# Patient Record
Sex: Male | Born: 1980 | Race: Black or African American | Hispanic: No | Marital: Single | Smoking: Never smoker
Health system: Southern US, Community
[De-identification: ages and names within clinical notes are randomized; demographics above are authoritative.]

## PROBLEM LIST (undated history)

## (undated) DIAGNOSIS — R519 Headache, unspecified: Secondary | ICD-10-CM

## (undated) DIAGNOSIS — R51 Headache: Secondary | ICD-10-CM

## (undated) HISTORY — PX: NO PAST SURGERIES: SHX2092

---

## 2007-02-19 ENCOUNTER — Emergency Department (HOSPITAL_COMMUNITY): Admission: EM | Admit: 2007-02-19 | Discharge: 2007-02-19 | Payer: Self-pay | Admitting: Emergency Medicine

## 2013-06-08 ENCOUNTER — Emergency Department (INDEPENDENT_AMBULATORY_CARE_PROVIDER_SITE_OTHER)
Admission: EM | Admit: 2013-06-08 | Discharge: 2013-06-08 | Disposition: A | Discharge status: Home / Self Care | Payer: Self-pay | Source: Home / Self Care | Attending: Family Medicine | Admitting: Family Medicine

## 2013-06-08 ENCOUNTER — Encounter (HOSPITAL_COMMUNITY): Payer: Self-pay | Admitting: Emergency Medicine

## 2013-06-08 ENCOUNTER — Emergency Department (INDEPENDENT_AMBULATORY_CARE_PROVIDER_SITE_OTHER): Payer: Self-pay

## 2013-06-08 DIAGNOSIS — M25569 Pain in unspecified knee: Secondary | ICD-10-CM

## 2013-06-08 DIAGNOSIS — M25561 Pain in right knee: Secondary | ICD-10-CM

## 2013-06-08 MED ORDER — DICLOFENAC POTASSIUM 50 MG PO TABS: 50.0000 mg | ORAL_TABLET | Freq: Three times a day (TID) | ORAL | Status: DC

## 2013-06-08 NOTE — ED Notes (Signed)
Pt  Reports  r  Knee  Pain   For  About  1  Month  Or  So  denys  Recent  Injury  But  The  Pain is  Worse  On  Weight  Bearing  And  Certain  Movements

## 2013-06-08 NOTE — Discharge Instructions (Signed)
Wear knee brace and use medicine for knee and see specialist for recheck, go to mental health center to discuss issues of concern.

## 2013-06-08 NOTE — ED Provider Notes (Signed)
CSN: [id number]     Arrival date & time 06/08/13  1427 History   First MD Initiated Contact with Patient 06/08/13 1450     Chief Complaint  Patient presents with  . Knee Pain   (Consider location/radiation/quality/duration/timing/severity/associated sxs/prior Treatment) Patient is a 33 y.o. male presenting with knee pain. The history is provided by the patient.  Knee Pain Location:  Knee Time since incident:  1 month Injury: no   Knee location:  R knee Pain details:    Quality:  Sharp   Radiates to:  Does not radiate   Severity:  Mild   Onset quality:  Gradual Chronicity:  Recurrent Dislocation: no   Foreign body present:  No foreign bodies Prior injury to area:  No Relieved by:  None tried Worsened by:  Bearing weight Associated symptoms: no decreased ROM, no muscle weakness and no swelling     History reviewed. No pertinent past medical history. History reviewed. No pertinent past surgical history. No family history on file. History  Substance Use Topics  . Smoking status: Never Smoker   . Smokeless tobacco: Not on file  . Alcohol Use: No    Review of Systems  Constitutional: Negative.   Musculoskeletal: Negative for gait problem and joint swelling.  Skin: Negative.     Allergies  Review of patient's allergies indicates not on file.  Home Medications   Prior to Admission medications   Not on File   BP 132/94  Pulse 74  Temp(Src) 99 F (37.2 C) (Oral)  Resp 16  SpO2 99% Physical Exam  Nursing note and vitals reviewed. Constitutional: He is oriented to person, place, and time. He appears well-developed and well-nourished.  Musculoskeletal: He exhibits tenderness.       Right knee: He exhibits normal range of motion, no swelling, no effusion, no erythema, normal alignment, no LCL laxity, normal patellar mobility, normal meniscus and no MCL laxity. Tenderness found. Patellar tendon tenderness noted. No medial joint line and no lateral joint line tenderness  noted.  Neurological: He is alert and oriented to person, place, and time.  Skin: Skin is warm and dry.    ED Course  Procedures (including critical care time) Labs Review Labs Reviewed - No data to display  Imaging Review Dg Knee Complete 4 Views Right  06/08/2013   CLINICAL DATA:  Pain  EXAM: RIGHT KNEE - COMPLETE 4+ VIEW  COMPARISON:  None.  FINDINGS: Frontal, lateral, and bilateral oblique views were obtained. There is no fracture, dislocation, or effusion. Joint spaces appear intact. No erosive change.  IMPRESSION: No abnormality noted.   Electronically Signed   By: Bretta BangWilliam  Woodruff M.D.   On: 06/08/2013 15:27   X-rays reviewed and report per radiologist.   MDM   1. Knee pain, right anterior        Linna HoffJames D Sayf Kerner, MD 06/08/13 [id number]

## 2016-08-16 ENCOUNTER — Emergency Department (HOSPITAL_COMMUNITY)
Admission: EM | Admit: 2016-08-16 | Discharge: 2016-08-16 | Disposition: A | Discharge status: Home / Self Care | Payer: No Typology Code available for payment source | Attending: Emergency Medicine | Admitting: Emergency Medicine

## 2016-08-16 ENCOUNTER — Encounter (HOSPITAL_COMMUNITY): Payer: Self-pay | Admitting: Emergency Medicine

## 2016-08-16 ENCOUNTER — Emergency Department (HOSPITAL_COMMUNITY): Payer: No Typology Code available for payment source

## 2016-08-16 DIAGNOSIS — Y9389 Activity, other specified: Secondary | ICD-10-CM | POA: Insufficient documentation

## 2016-08-16 DIAGNOSIS — Y998 Other external cause status: Secondary | ICD-10-CM | POA: Diagnosis not present

## 2016-08-16 DIAGNOSIS — Y92410 Unspecified street and highway as the place of occurrence of the external cause: Secondary | ICD-10-CM | POA: Insufficient documentation

## 2016-08-16 DIAGNOSIS — S199XXA Unspecified injury of neck, initial encounter: Secondary | ICD-10-CM | POA: Diagnosis present

## 2016-08-16 DIAGNOSIS — S14109A Unspecified injury at unspecified level of cervical spinal cord, initial encounter: Secondary | ICD-10-CM

## 2016-08-16 DIAGNOSIS — S161XXA Strain of muscle, fascia and tendon at neck level, initial encounter: Secondary | ICD-10-CM | POA: Diagnosis not present

## 2016-08-16 DIAGNOSIS — R202 Paresthesia of skin: Secondary | ICD-10-CM

## 2016-08-16 LAB — BASIC METABOLIC PANEL
ANION GAP: 7 (ref 5–15)
BUN: 6 mg/dL (ref 6–20)
CALCIUM: 9 mg/dL (ref 8.9–10.3)
CO2: 27 mmol/L (ref 22–32)
Chloride: 105 mmol/L (ref 101–111)
Creatinine, Ser: 1.14 mg/dL (ref 0.61–1.24)
Glucose, Bld: 105 mg/dL — ABNORMAL HIGH (ref 65–99)
POTASSIUM: 3.7 mmol/L (ref 3.5–5.1)
Sodium: 139 mmol/L (ref 135–145)

## 2016-08-16 LAB — CBC
HEMATOCRIT: 38.3 % — AB (ref 39.0–52.0)
Hemoglobin: 13.1 g/dL (ref 13.0–17.0)
MCH: 32.1 pg (ref 26.0–34.0)
MCHC: 34.2 g/dL (ref 30.0–36.0)
MCV: 93.9 fL (ref 78.0–100.0)
PLATELETS: 186 10*3/uL (ref 150–400)
RBC: 4.08 MIL/uL — AB (ref 4.22–5.81)
RDW: 11.8 % (ref 11.5–15.5)
WBC: 10.2 10*3/uL (ref 4.0–10.5)

## 2016-08-16 MED ORDER — KETOROLAC TROMETHAMINE 15 MG/ML IJ SOLN
15.0000 mg | Freq: Once | INTRAMUSCULAR | Status: AC
  Administered 2016-08-16: 15 mg via INTRAVENOUS
  Filled 2016-08-16: qty 1

## 2016-08-16 MED ORDER — GABAPENTIN 100 MG PO CAPS: 200.0000 mg | ORAL_CAPSULE | Freq: Three times a day (TID) | ORAL | 0 refills | Status: AC

## 2016-08-16 MED ORDER — FENTANYL CITRATE (PF) 100 MCG/2ML IJ SOLN
50.0000 ug | INTRAMUSCULAR | Status: DC | PRN
  Administered 2016-08-16 (×2): 50 ug via INTRAVENOUS
  Filled 2016-08-16 (×2): qty 2
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
## 2016-08-16 NOTE — ED Notes (Signed)
Pt out of the dept. At this time.  Family waiting in room

## 2016-08-16 NOTE — Discharge Instructions (Signed)
Take Tylenol and ibuprofen as needed for pain. For nervelike pain you can take gabapentin. Follow-up with neurosurgery. If he develop worsening neurologic symptoms such as weakness, inability to urinate or other concerns please call your surgery office immediately or come to the emergency department.

## 2016-08-16 NOTE — ED Notes (Signed)
Pt returned from MRI °

## 2016-08-16 NOTE — ED Notes (Signed)
Pt to CT at this time.

## 2016-08-16 NOTE — ED Notes (Signed)
Pt requesting more pain meds.  Fentanyl IV given for same
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
## 2016-08-20 NOTE — ED Notes (Signed)
Pt. Called and left message on this RN's office voicemail. Pt. Requested a call back for information on his discharge. (Pt., stated , "I lost my papers.")   Called pt. No answer , unable to leave a message

## 2016-08-28 ENCOUNTER — Other Ambulatory Visit: Payer: Self-pay | Admitting: Neurological Surgery

## 2016-09-03 ENCOUNTER — Encounter (HOSPITAL_COMMUNITY): Payer: Self-pay

## 2016-09-03 ENCOUNTER — Other Ambulatory Visit (HOSPITAL_COMMUNITY): Payer: Self-pay | Admitting: *Deleted

## 2016-09-03 ENCOUNTER — Encounter (HOSPITAL_COMMUNITY)
Admission: RE | Admit: 2016-09-03 | Discharge: 2016-09-03 | Disposition: A | Discharge status: Home / Self Care | Payer: Commercial Managed Care - PPO | Source: Ambulatory Visit | Attending: Neurological Surgery | Admitting: Neurological Surgery

## 2016-09-03 DIAGNOSIS — M4802 Spinal stenosis, cervical region: Secondary | ICD-10-CM

## 2016-09-03 DIAGNOSIS — Z01812 Encounter for preprocedural laboratory examination: Secondary | ICD-10-CM

## 2016-09-03 HISTORY — DX: Headache: R51

## 2016-09-03 HISTORY — DX: Headache, unspecified: R51.9

## 2016-09-03 LAB — ABO/RH: ABO/RH(D): O POS

## 2016-09-03 LAB — CBC
HEMATOCRIT: 41.3 % (ref 39.0–52.0)
Hemoglobin: 14.1 g/dL (ref 13.0–17.0)
MCH: 31.7 pg (ref 26.0–34.0)
MCHC: 34.1 g/dL (ref 30.0–36.0)
MCV: 92.8 fL (ref 78.0–100.0)
Platelets: 207 10*3/uL (ref 150–400)
RBC: 4.45 MIL/uL (ref 4.22–5.81)
RDW: 11.6 % (ref 11.5–15.5)
WBC: 6.1 10*3/uL (ref 4.0–10.5)

## 2016-09-03 LAB — SURGICAL PCR SCREEN
MRSA, PCR: NEGATIVE
STAPHYLOCOCCUS AUREUS: NEGATIVE

## 2016-09-03 LAB — TYPE AND SCREEN
ABO/RH(D): O POS
Antibody Screen: NEGATIVE

## 2016-09-03 NOTE — Pre-Procedure Instructions (Addendum)
Estanislado PandyJames Lewman  09/03/2016    Your procedure is scheduled on Friday, September 06, 2016 at 7:30 AM.   [facility] En[facility] "A" Admitting Office at 5:30 AM.   Call this number if you have problems the morning of surgery: [phone]   Questions prior to day of surgery, please call [phone] between 8 & 4 PM.   Remember:  Do not eat food or drink liquids after midnight Thursday, 09/05/16.  Take these medicines the morning of surgery with A SIP OF WATER: Gabapentin (Neurontin)  STOP all herbel meds, nsaids (aleve,naproxen,advil,ibuprofen) prior to surgery starting now 09/03/16  including all supplements//vitamins, aspirin, goodys,fish oil, bc powder   Do not wear jewelry.  Do not wear lotions, powders, cologen or deodorant.  Do not shave 48 hours prior to surgery.  Men may shave face and neck.  Do not bring valuables to the hospital.  [facility] any belongings or valuables.  Contacts, dentures or bridgework may not be worn into surgery.  Leave your suitcase in the car.  After surgery it may be brought to your room.  For patients admitted to the hospital, discharge time will be determined by your treatment team.  [facility] - Preparing for [facility]  Before surgery, you can play an important role.  Because skin is not sterile, your skin needs to be as free of germs as possible.  You can reduce the number of germs on you skin by washing with CHG (chlorahexidine gluconate) soap before surgery.  CHG is an antiseptic cleaner which kills germs and bonds with the skin to continue killing germs even after washing.  Please DO NOT use if you have an allergy to CHG or antibacterial soaps.  If your skin becomes reddened/irritated stop using the CHG and inform your nurse when you arrive at Short Stay.  Do not shave (including legs and underarms) for at least 48 hours prior to the first CHG shower.  You may shave your face.  Please follow these instructions  carefully:   1.  Shower with CHG Soap the night before surgery and the                    morning of Surgery.  2.  If you choose to wash your hair, wash your hair first as usual with your       normal shampoo.  3.  After you shampoo, rinse your hair and body thoroughly to remove the shampoo.  4.  Use CHG as you would any other liquid soap.  You can apply chg directly       to the skin and wash gently with scrungie or a clean washcloth.  5.  Apply the CHG Soap to your body ONLY FROM THE NECK DOWN.        Do not use on open wounds or open sores.  Avoid contact with your eyes, ears, mouth and genitals (private parts).  Wash genitals (private parts) with your normal soap.  6.  Wash thoroughly, paying special attention to the area where your surgery        will be performed.  7.  Thoroughly rinse your body with warm water from the neck down.  8.  DO NOT shower/wash with your normal soap after using and rinsing off       the CHG Soap.  9.  Pat yourself dry with a clean towel.  10.  Wear clean pajamas.            11.  Place clean sheets on your bed the night of your first shower and do not        sleep with pets.  Day of Surgery  Do not apply any lotions/deodorants the morning of surgery.  Please wear clean clothes to the hospital.   Please read over the fact sheets that you were given.

## 2016-09-03 NOTE — Pre-Procedure Instructions (Signed)
Andre Salinas  09/03/2016    Your procedure is scheduled on Friday, September 06, 2016 at 7:30 AM.   Report to [facility] Entrance "A" Admitting Office at 5:30 AM.   Call this number if you have problems the morning of surgery: [phone]   Questions prior to day of surgery, please call [phone] between 8 & 4 PM.   Remember:  Do not eat food or drink liquids after midnight Thursday, 09/05/16.  Take these medicines the morning of surgery with A SIP OF WATER: Gabapentin (Neurontin)   Do not wear jewelry.  Do not wear lotions, powders, cologen or deodorant.  Do not shave 48 hours prior to surgery.  Men may shave face and neck.  Do not bring valuables to the hospital.  [facility] is not responsible for any belongings or valuables.  Contacts, dentures or bridgework may not be worn into surgery.  Leave your suitcase in the car.  After surgery it may be brought to your room.  For patients admitted to the hospital, discharge time will be determined by your treatment team.  [facility] - Preparing for Surgery  Before surgery, you can play an important role.  Because skin is not sterile, your skin needs to be as free of germs as possible.  You can reduce the number of germs on you skin by washing with CHG (chlorahexidine gluconate) soap before surgery.  CHG is an antiseptic cleaner which kills germs and bonds with the skin to continue killing germs even after washing.  Please DO NOT use if you have an allergy to CHG or antibacterial soaps.  If your skin becomes reddened/irritated stop using the CHG and inform your nurse when you arrive at Short Stay.  Do not shave (including legs and underarms) for at least 48 hours prior to the first CHG shower.  You may shave your face.  Please follow these instructions carefully:   1.  Shower with CHG Soap the night before surgery and the                    morning of Surgery.  2.  If you choose to wash your hair, wash your hair first as  usual with your       normal shampoo.  3.  After you shampoo, rinse your hair and body thoroughly to remove the shampoo.  4.  Use CHG as you would any other liquid soap.  You can apply chg directly       to the skin and wash gently with scrungie or a clean washcloth.  5.  Apply the CHG Soap to your body ONLY FROM THE NECK DOWN.        Do not use on open wounds or open sores.  Avoid contact with your eyes, ears, mouth and genitals (private parts).  Wash genitals (private parts) with your normal soap.  6.  Wash thoroughly, paying special attention to the area where your surgery        will be performed.  7.  Thoroughly rinse your body with warm water from the neck down.  8.  DO NOT shower/wash with your normal soap after using and rinsing off       the CHG Soap.  9.  Pat yourself dry with a clean towel.            10.  Wear clean pajamas.            11.  Place clean sheets  on your bed the night of your first shower and do not        sleep with pets.  Day of Surgery  Do not apply any lotions/deodorants the morning of surgery.  Please wear clean clothes to the hospital.   Please read over the fact sheets that you were given.

## 2016-09-05 NOTE — Anesthesia Preprocedure Evaluation (Addendum)
Anesthesia Evaluation  Patient identified by MRN, date of birth, ID band Patient awake    Reviewed: Allergy & Precautions, NPO status , Patient's Chart, lab work & pertinent test results  History of Anesthesia Complications Negative for: history of anesthetic complications  Airway Mallampati: I  TM Distance: >3 FB Neck ROM: Limited    Dental  (+) Teeth Intact, Dental Advisory Given   Pulmonary neg pulmonary ROS,  Asthma as a child   breath sounds clear to auscultation       Cardiovascular Exercise Tolerance: Good negative cardio ROS   Rhythm:Regular Rate:Normal     Neuro/Psych  Headaches, tingling and numbness in bilateral arms (worse in left) negative psych ROS   GI/Hepatic negative GI ROS, Neg liver ROS,   Endo/Other  negative endocrine ROS  Renal/GU negative Renal ROS  negative genitourinary   Musculoskeletal negative musculoskeletal ROS (+)   Abdominal   Peds negative pediatric ROS (+)  Hematology negative hematology ROS (+)   Anesthesia Other Findings   Reproductive/Obstetrics                            BP Readings from Last 3 Encounters:  09/06/16 129/77  09/03/16 119/71  08/16/16 121/88   Lab Results  Component Value Date   WBC 6.1 09/03/2016   HGB 14.1 09/03/2016   HCT 41.3 09/03/2016   MCV 92.8 09/03/2016   PLT 207 09/03/2016    Anesthesia Physical Anesthesia Plan  ASA: I  Anesthesia Plan: General   Post-op Pain Management:    Induction: Intravenous  PONV Risk Score and Plan: 3 and Ondansetron, Dexamethasone and Midazolam  Airway Management Planned: Video Laryngoscope Planned and Oral ETT  Additional Equipment:   Intra-op Plan:   Post-operative Plan: Extubation in OR  Informed Consent: I have reviewed the patients History and Physical, chart, labs and discussed the procedure including the risks, benefits and alternatives for the proposed anesthesia  with the patient or authorized representative who has indicated his/her understanding and acceptance.   Dental advisory given  Plan Discussed with: CRNA and Surgeon  Anesthesia Plan Comments: (Plan routine monitors, GETA with VideoGlide intubation)        Anesthesia Quick Evaluation

## 2016-09-06 ENCOUNTER — Inpatient Hospital Stay (HOSPITAL_COMMUNITY): Payer: Commercial Managed Care - PPO | Admitting: Certified Registered Nurse Anesthetist

## 2016-09-06 ENCOUNTER — Inpatient Hospital Stay (HOSPITAL_COMMUNITY)
Admission: RE | Admit: 2016-09-06 | Discharge: 2016-09-07 | DRG: 030 | Disposition: A | Discharge status: Home / Self Care | Payer: Commercial Managed Care - PPO | Source: Ambulatory Visit | Attending: Neurological Surgery | Admitting: Neurological Surgery

## 2016-09-06 ENCOUNTER — Encounter (HOSPITAL_COMMUNITY)
Admission: RE | Disposition: A | Discharge status: Home / Self Care | Payer: Self-pay | Source: Ambulatory Visit | Attending: Neurological Surgery

## 2016-09-06 ENCOUNTER — Inpatient Hospital Stay (HOSPITAL_COMMUNITY): Payer: Commercial Managed Care - PPO

## 2016-09-06 ENCOUNTER — Encounter (HOSPITAL_COMMUNITY): Payer: Self-pay | Admitting: *Deleted

## 2016-09-06 DIAGNOSIS — Z91018 Allergy to other foods: Secondary | ICD-10-CM

## 2016-09-06 DIAGNOSIS — M4802 Spinal stenosis, cervical region: Secondary | ICD-10-CM | POA: Diagnosis present

## 2016-09-06 DIAGNOSIS — S14124S Central cord syndrome at C4 level of cervical spinal cord, sequela: Secondary | ICD-10-CM | POA: Diagnosis not present

## 2016-09-06 HISTORY — PX: POSTERIOR CERVICAL FUSION/FORAMINOTOMY: SHX5038

## 2016-09-06 SURGERY — POSTERIOR CERVICAL FUSION/FORAMINOTOMY LEVEL 3
Anesthesia: General

## 2016-09-06 MED ORDER — HEMOSTATIC AGENTS (NO CHARGE) OPTIME
TOPICAL | Status: DC | PRN
  Administered 2016-09-06: 1 via TOPICAL

## 2016-09-06 MED ORDER — THROMBIN 5000 UNITS EX SOLR
CUTANEOUS | Status: AC
  Filled 2016-09-06: qty 5000

## 2016-09-06 MED ORDER — OXYCODONE HCL ER 15 MG PO T12A
15.0000 mg | EXTENDED_RELEASE_TABLET | Freq: Two times a day (BID) | ORAL | Status: DC
  Administered 2016-09-06 – 2016-09-07 (×3): 15 mg via ORAL
  Filled 2016-09-06 (×3): qty 1

## 2016-09-06 MED ORDER — MENTHOL 3 MG MT LOZG: 1.0000 | LOZENGE | OROMUCOSAL | Status: DC | PRN

## 2016-09-06 MED ORDER — BUPIVACAINE LIPOSOME 1.3 % IJ SUSP
20.0000 mL | INTRAMUSCULAR | Status: DC
  Filled 2016-09-06: qty 20

## 2016-09-06 MED ORDER — PANTOPRAZOLE SODIUM 40 MG IV SOLR: 40.0000 mg | Freq: Every day | INTRAVENOUS | Status: DC

## 2016-09-06 MED ORDER — SODIUM CHLORIDE 0.9 % IJ SOLN
INTRAMUSCULAR | Status: AC
  Filled 2016-09-06: qty 10

## 2016-09-06 MED ORDER — ONDANSETRON HCL 4 MG/2ML IJ SOLN
INTRAMUSCULAR | Status: DC | PRN
  Administered 2016-09-06 (×2): 4 mg via INTRAVENOUS

## 2016-09-06 MED ORDER — SUGAMMADEX SODIUM 500 MG/5ML IV SOLN
INTRAVENOUS | Status: DC | PRN
  Administered 2016-09-06: 400 mg via INTRAVENOUS

## 2016-09-06 MED ORDER — PANTOPRAZOLE SODIUM 40 MG PO TBEC
40.0000 mg | DELAYED_RELEASE_TABLET | Freq: Every day | ORAL | Status: DC
  Administered 2016-09-06: 40 mg via ORAL
  Filled 2016-09-06: qty 1

## 2016-09-06 MED ORDER — HYDROMORPHONE HCL 1 MG/ML IJ SOLN: 0.2500 mg | INTRAMUSCULAR | Status: DC | PRN

## 2016-09-06 MED ORDER — BUPIVACAINE-EPINEPHRINE 0.5% -1:200000 IJ SOLN
INTRAMUSCULAR | Status: DC | PRN
  Administered 2016-09-06: 10 mL

## 2016-09-06 MED ORDER — ONDANSETRON HCL 4 MG PO TABS: 4.0000 mg | ORAL_TABLET | Freq: Four times a day (QID) | ORAL | Status: DC | PRN

## 2016-09-06 MED ORDER — SENNA 8.6 MG PO TABS
1.0000 | ORAL_TABLET | Freq: Two times a day (BID) | ORAL | Status: DC
  Administered 2016-09-06 – 2016-09-07 (×3): 8.6 mg via ORAL
  Filled 2016-09-06 (×3): qty 1

## 2016-09-06 MED ORDER — PROMETHAZINE HCL 25 MG/ML IJ SOLN: 6.2500 mg | INTRAMUSCULAR | Status: DC | PRN

## 2016-09-06 MED ORDER — BUPIVACAINE-EPINEPHRINE (PF) 0.5% -1:200000 IJ SOLN
INTRAMUSCULAR | Status: AC
  Filled 2016-09-06: qty 30

## 2016-09-06 MED ORDER — DIAZEPAM 5 MG PO TABS
5.0000 mg | ORAL_TABLET | Freq: Four times a day (QID) | ORAL | Status: DC | PRN
  Administered 2016-09-06: 5 mg via ORAL
  Filled 2016-09-06: qty 1

## 2016-09-06 MED ORDER — LIDOCAINE-EPINEPHRINE 2 %-1:100000 IJ SOLN
INTRAMUSCULAR | Status: AC
  Filled 2016-09-06: qty 1

## 2016-09-06 MED ORDER — ROCURONIUM BROMIDE 10 MG/ML (PF) SYRINGE
PREFILLED_SYRINGE | INTRAVENOUS | Status: AC
  Filled 2016-09-06: qty 10

## 2016-09-06 MED ORDER — OXYCODONE HCL 5 MG PO TABS
5.0000 mg | ORAL_TABLET | ORAL | Status: DC | PRN
  Administered 2016-09-06 – 2016-09-07 (×5): 10 mg via ORAL
  Filled 2016-09-06 (×5): qty 2

## 2016-09-06 MED ORDER — FENTANYL CITRATE (PF) 250 MCG/5ML IJ SOLN
INTRAMUSCULAR | Status: AC
Start: 2016-09-06 — End: ?
  Filled 2016-09-06: qty 5

## 2016-09-06 MED ORDER — LIDOCAINE 2% (20 MG/ML) 5 ML SYRINGE
INTRAMUSCULAR | Status: DC | PRN
  Administered 2016-09-06: 20 mg via INTRAVENOUS

## 2016-09-06 MED ORDER — SUGAMMADEX SODIUM 500 MG/5ML IV SOLN
INTRAVENOUS | Status: AC
  Filled 2016-09-06: qty 5

## 2016-09-06 MED ORDER — PHENYLEPHRINE HCL 10 MG/ML IJ SOLN
INTRAVENOUS | Status: DC | PRN
  Administered 2016-09-06: 25 ug/min via INTRAVENOUS

## 2016-09-06 MED ORDER — MIDAZOLAM HCL 2 MG/2ML IJ SOLN
INTRAMUSCULAR | Status: AC
  Filled 2016-09-06: qty 2

## 2016-09-06 MED ORDER — DEXAMETHASONE SODIUM PHOSPHATE 10 MG/ML IJ SOLN
INTRAMUSCULAR | Status: DC | PRN
Start: 2016-09-06 — End: 2016-09-06
  Administered 2016-09-06: 10 mg via INTRAVENOUS

## 2016-09-06 MED ORDER — LIDOCAINE-EPINEPHRINE 2 %-1:100000 IJ SOLN
INTRAMUSCULAR | Status: DC | PRN
  Administered 2016-09-06: 10 mL

## 2016-09-06 MED ORDER — BISACODYL 10 MG RE SUPP: 10.0000 mg | Freq: Every day | RECTAL | Status: DC | PRN

## 2016-09-06 MED ORDER — LACTATED RINGERS IV SOLN
INTRAVENOUS | Status: DC | PRN
  Administered 2016-09-06 (×2): via INTRAVENOUS

## 2016-09-06 MED ORDER — SODIUM CHLORIDE 0.9 % IR SOLN
Status: DC | PRN
  Administered 2016-09-06: 09:00:00

## 2016-09-06 MED ORDER — FENTANYL CITRATE (PF) 250 MCG/5ML IJ SOLN
INTRAMUSCULAR | Status: AC
  Filled 2016-09-06: qty 5

## 2016-09-06 MED ORDER — VANCOMYCIN HCL 1000 MG IV SOLR
INTRAVENOUS | Status: AC
  Filled 2016-09-06: qty 1000

## 2016-09-06 MED ORDER — PROPOFOL 10 MG/ML IV BOLUS
INTRAVENOUS | Status: DC | PRN
  Administered 2016-09-06: 150 mg via INTRAVENOUS
  Administered 2016-09-06: 50 mg via INTRAVENOUS

## 2016-09-06 MED ORDER — ONDANSETRON HCL 4 MG/2ML IJ SOLN: 4.0000 mg | Freq: Four times a day (QID) | INTRAMUSCULAR | Status: DC | PRN

## 2016-09-06 MED ORDER — CEFAZOLIN SODIUM-DEXTROSE 1-4 GM/50ML-% IV SOLN
1.0000 g | Freq: Three times a day (TID) | INTRAVENOUS | Status: AC
  Administered 2016-09-06 (×2): 1 g via INTRAVENOUS
  Filled 2016-09-06 (×2): qty 50

## 2016-09-06 MED ORDER — BACITRACIN ZINC 500 UNIT/GM EX OINT
TOPICAL_OINTMENT | CUTANEOUS | Status: DC | PRN
  Administered 2016-09-06: 1 via TOPICAL

## 2016-09-06 MED ORDER — 0.9 % SODIUM CHLORIDE (POUR BTL) OPTIME
TOPICAL | Status: DC | PRN
  Administered 2016-09-06: 1000 mL

## 2016-09-06 MED ORDER — SODIUM CHLORIDE 0.9 % IV SOLN
INTRAVENOUS | Status: DC | PRN
  Administered 2016-09-06: 09:00:00

## 2016-09-06 MED ORDER — BACITRACIN ZINC 500 UNIT/GM EX OINT
TOPICAL_OINTMENT | CUTANEOUS | Status: AC
  Filled 2016-09-06: qty 28.35

## 2016-09-06 MED ORDER — MEPERIDINE HCL 25 MG/ML IJ SOLN: 6.2500 mg | INTRAMUSCULAR | Status: DC | PRN

## 2016-09-06 MED ORDER — SODIUM CHLORIDE 0.9% FLUSH: 3.0000 mL | INTRAVENOUS | Status: DC | PRN

## 2016-09-06 MED ORDER — METHYLPREDNISOLONE ACETATE 80 MG/ML IJ SUSP
INTRAMUSCULAR | Status: AC
  Filled 2016-09-06: qty 1

## 2016-09-06 MED ORDER — ACETAMINOPHEN 500 MG PO TABS
1000.0000 mg | ORAL_TABLET | Freq: Four times a day (QID) | ORAL | Status: DC
  Administered 2016-09-06 – 2016-09-07 (×4): 1000 mg via ORAL
  Filled 2016-09-06 (×4): qty 2

## 2016-09-06 MED ORDER — THROMBIN 5000 UNITS EX SOLR
OROMUCOSAL | Status: DC | PRN
  Administered 2016-09-06 (×2): via TOPICAL

## 2016-09-06 MED ORDER — ZOLPIDEM TARTRATE 5 MG PO TABS: 5.0000 mg | ORAL_TABLET | Freq: Every evening | ORAL | Status: DC | PRN

## 2016-09-06 MED ORDER — GABAPENTIN 300 MG PO CAPS
300.0000 mg | ORAL_CAPSULE | Freq: Three times a day (TID) | ORAL | Status: DC
  Administered 2016-09-06 – 2016-09-07 (×3): 300 mg via ORAL
  Filled 2016-09-06 (×3): qty 1

## 2016-09-06 MED ORDER — THROMBIN 5000 UNITS EX SOLR
CUTANEOUS | Status: AC
  Filled 2016-09-06: qty 15000

## 2016-09-06 MED ORDER — PHENOL 1.4 % MT LIQD: 1.0000 | OROMUCOSAL | Status: DC | PRN

## 2016-09-06 MED ORDER — LIDOCAINE 2% (20 MG/ML) 5 ML SYRINGE
INTRAMUSCULAR | Status: AC
  Filled 2016-09-06: qty 5

## 2016-09-06 MED ORDER — CEFAZOLIN SODIUM-DEXTROSE 2-4 GM/100ML-% IV SOLN
INTRAVENOUS | Status: AC
  Filled 2016-09-06: qty 100

## 2016-09-06 MED ORDER — SODIUM CHLORIDE 0.9% FLUSH
3.0000 mL | Freq: Two times a day (BID) | INTRAVENOUS | Status: DC
  Administered 2016-09-06: 3 mL via INTRAVENOUS

## 2016-09-06 MED ORDER — DOCUSATE SODIUM 100 MG PO CAPS
100.0000 mg | ORAL_CAPSULE | Freq: Two times a day (BID) | ORAL | Status: DC
  Administered 2016-09-06 – 2016-09-07 (×3): 100 mg via ORAL
  Filled 2016-09-06 (×3): qty 1

## 2016-09-06 MED ORDER — ROCURONIUM BROMIDE 10 MG/ML (PF) SYRINGE
PREFILLED_SYRINGE | INTRAVENOUS | Status: DC | PRN
  Administered 2016-09-06: 50 mg via INTRAVENOUS
  Administered 2016-09-06: 40 mg via INTRAVENOUS
  Administered 2016-09-06: 10 mg via INTRAVENOUS
  Administered 2016-09-06: 20 mg via INTRAVENOUS

## 2016-09-06 MED ORDER — VANCOMYCIN HCL POWD
Status: DC | PRN
  Administered 2016-09-06: 1000 mg via TOPICAL

## 2016-09-06 MED ORDER — MIDAZOLAM HCL 2 MG/2ML IJ SOLN: 0.5000 mg | Freq: Once | INTRAMUSCULAR | Status: DC | PRN

## 2016-09-06 MED ORDER — PROPOFOL 10 MG/ML IV BOLUS
INTRAVENOUS | Status: AC
  Filled 2016-09-06: qty 40

## 2016-09-06 MED ORDER — THROMBIN 5000 UNITS EX SOLR
CUTANEOUS | Status: DC | PRN
  Administered 2016-09-06 (×2): 5000 [IU] via TOPICAL

## 2016-09-06 MED ORDER — ONDANSETRON HCL 4 MG/2ML IJ SOLN
INTRAMUSCULAR | Status: AC
  Filled 2016-09-06: qty 2

## 2016-09-06 MED ORDER — CHLORHEXIDINE GLUCONATE CLOTH 2 % EX PADS: 6.0000 | MEDICATED_PAD | Freq: Once | CUTANEOUS | Status: DC

## 2016-09-06 MED ORDER — FENTANYL CITRATE (PF) 250 MCG/5ML IJ SOLN
INTRAMUSCULAR | Status: DC | PRN
  Administered 2016-09-06: 200 ug via INTRAVENOUS
  Administered 2016-09-06 (×4): 50 ug via INTRAVENOUS

## 2016-09-06 MED ORDER — METHOCARBAMOL 750 MG PO TABS
750.0000 mg | ORAL_TABLET | Freq: Four times a day (QID) | ORAL | Status: DC
  Administered 2016-09-06 – 2016-09-07 (×4): 750 mg via ORAL
  Filled 2016-09-06 (×4): qty 1

## 2016-09-06 MED ORDER — FLEET ENEMA 7-19 GM/118ML RE ENEM: 1.0000 | ENEMA | Freq: Once | RECTAL | Status: DC | PRN

## 2016-09-06 MED ORDER — MIDAZOLAM HCL 2 MG/2ML IJ SOLN
INTRAMUSCULAR | Status: DC | PRN
  Administered 2016-09-06: 2 mg via INTRAVENOUS

## 2016-09-06 MED ORDER — CEFAZOLIN SODIUM-DEXTROSE 2-4 GM/100ML-% IV SOLN
2.0000 g | INTRAVENOUS | Status: AC
  Administered 2016-09-06: 2 g via INTRAVENOUS

## 2016-09-06 MED ORDER — SODIUM CHLORIDE 0.9 % IV SOLN
INTRAVENOUS | Status: DC
  Administered 2016-09-06: 14:00:00 via INTRAVENOUS

## 2016-09-06 MED ORDER — CELECOXIB 200 MG PO CAPS
200.0000 mg | ORAL_CAPSULE | Freq: Two times a day (BID) | ORAL | Status: DC
  Administered 2016-09-06 – 2016-09-07 (×2): 200 mg via ORAL
  Filled 2016-09-06 (×2): qty 1

## 2016-09-06 MED ORDER — DEXAMETHASONE SODIUM PHOSPHATE 10 MG/ML IJ SOLN
INTRAMUSCULAR | Status: AC
  Filled 2016-09-06: qty 1

## 2016-09-06 MED ORDER — SODIUM CHLORIDE 0.9 % IV SOLN: 250.0000 mL | INTRAVENOUS | Status: DC

## 2016-09-06 SURGICAL SUPPLY — 85 items
BENZOIN TINCTURE PRP APPL 2/3 (GAUZE/BANDAGES/DRESSINGS) ×3 IMPLANT
BLADE CLIPPER SURG (BLADE) ×3 IMPLANT
BLADE ULTRA TIP 2M (BLADE) IMPLANT
BONE EQUIVA 5CC (Bone Implant) ×3 IMPLANT
BUR MATCHSTICK NEURO 3.0 LAGG (BURR) IMPLANT
BUR PRECISION FLUTE 5.0 (BURR) IMPLANT
CANISTER SUCT 3000ML PPV (MISCELLANEOUS) ×3 IMPLANT
CAP CLSR POST CERV (Cap) ×24 IMPLANT
CARTRIDGE OIL MAESTRO DRILL (MISCELLANEOUS) ×1 IMPLANT
CHLORAPREP W/TINT 26ML (MISCELLANEOUS) ×3 IMPLANT
CLOSURE WOUND 1/2 X4 (GAUZE/BANDAGES/DRESSINGS)
COVER BACK TABLE 60X90IN (DRAPES) ×3 IMPLANT
DECANTER SPIKE VIAL GLASS SM (MISCELLANEOUS) ×3 IMPLANT
DERMABOND ADVANCED (GAUZE/BANDAGES/DRESSINGS) ×2
DERMABOND ADVANCED .7 DNX12 (GAUZE/BANDAGES/DRESSINGS) ×1 IMPLANT
DIFFUSER DRILL AIR PNEUMATIC (MISCELLANEOUS) ×3 IMPLANT
DRAPE C-ARM 42X72 X-RAY (DRAPES) ×6 IMPLANT
DRAPE C-ARMOR (DRAPES) ×3 IMPLANT
DRAPE MICROSCOPE LEICA (MISCELLANEOUS) IMPLANT
DRAPE POUCH INSTRU U-SHP 10X18 (DRAPES) ×3 IMPLANT
DRSG OPSITE POSTOP 4X6 (GAUZE/BANDAGES/DRESSINGS) ×3 IMPLANT
DRSG PAD ABDOMINAL 8X10 ST (GAUZE/BANDAGES/DRESSINGS) IMPLANT
ELECT REM PT RETURN 9FT ADLT (ELECTROSURGICAL) ×3
ELECTRODE REM PT RTRN 9FT ADLT (ELECTROSURGICAL) ×1 IMPLANT
EVACUATOR 1/8 PVC DRAIN (DRAIN) ×3 IMPLANT
GAUZE SPONGE 4X4 12PLY STRL (GAUZE/BANDAGES/DRESSINGS) ×3 IMPLANT
GAUZE SPONGE 4X4 16PLY XRAY LF (GAUZE/BANDAGES/DRESSINGS) IMPLANT
GLOVE BIO SURGEON STRL SZ7 (GLOVE) IMPLANT
GLOVE BIOGEL PI IND STRL 7.0 (GLOVE) IMPLANT
GLOVE BIOGEL PI IND STRL 7.5 (GLOVE) ×2 IMPLANT
GLOVE BIOGEL PI INDICATOR 7.0 (GLOVE)
GLOVE BIOGEL PI INDICATOR 7.5 (GLOVE) ×4
GLOVE SS BIOGEL STRL SZ 7.5 (GLOVE) ×1 IMPLANT
GLOVE SUPERSENSE BIOGEL SZ 7.5 (GLOVE) ×2
GOWN STRL REUS W/ TWL LRG LVL3 (GOWN DISPOSABLE) IMPLANT
GOWN STRL REUS W/ TWL XL LVL3 (GOWN DISPOSABLE) IMPLANT
GOWN STRL REUS W/TWL 2XL LVL3 (GOWN DISPOSABLE) IMPLANT
GOWN STRL REUS W/TWL LRG LVL3 (GOWN DISPOSABLE)
GOWN STRL REUS W/TWL XL LVL3 (GOWN DISPOSABLE)
HEMOSTAT POWDER SURGIFOAM 1G (HEMOSTASIS) ×6 IMPLANT
HEMOSTAT SURGICEL 2X14 (HEMOSTASIS) IMPLANT
KIT BASIN OR (CUSTOM PROCEDURE TRAY) ×3 IMPLANT
KIT INFUSE SMALL (Orthopedic Implant) ×3 IMPLANT
KIT ROOM TURNOVER OR (KITS) ×3 IMPLANT
MARKER SKIN DUAL TIP RULER LAB (MISCELLANEOUS) ×3 IMPLANT
NEEDLE HYPO 18GX1.5 BLUNT FILL (NEEDLE) IMPLANT
NEEDLE HYPO 21X1.5 SAFETY (NEEDLE) ×6 IMPLANT
NEEDLE SPNL 22GX3.5 QUINCKE BK (NEEDLE) ×3 IMPLANT
NS IRRIG 1000ML POUR BTL (IV SOLUTION) ×3 IMPLANT
OIL CARTRIDGE MAESTRO DRILL (MISCELLANEOUS) ×3
PACK LAMINECTOMY NEURO (CUSTOM PROCEDURE TRAY) ×3 IMPLANT
PACK UNIVERSAL I (CUSTOM PROCEDURE TRAY) ×3 IMPLANT
PATTIES SURGICAL .5X1.5 (GAUZE/BANDAGES/DRESSINGS) IMPLANT
PIN MAYFIELD SKULL DISP (PIN) ×3 IMPLANT
ROD VIRAGE 3.5X60 (Rod) ×6 IMPLANT
RUBBERBAND STERILE (MISCELLANEOUS) IMPLANT
SCREW VIRAGE 3.5X26MM POLY (Cage) ×6 IMPLANT
SCREW VIRAGE POST CERV 3.5X16 (Screw) ×18 IMPLANT
SEALER BIPOLAR AQUA 2.3 (INSTRUMENTS) ×3 IMPLANT
SPONGE INTESTINAL PEANUT (DISPOSABLE) IMPLANT
SPONGE NEURO XRAY DETECT 1X3 (DISPOSABLE) ×3 IMPLANT
SPONGE SURGIFOAM ABS GEL SZ50 (HEMOSTASIS) ×3 IMPLANT
STAPLER VISISTAT 35W (STAPLE) ×3 IMPLANT
STRIP CLOSURE SKIN 1/2X4 (GAUZE/BANDAGES/DRESSINGS) IMPLANT
STRIP SURGICAL 1 X 6 IN (GAUZE/BANDAGES/DRESSINGS) IMPLANT
STRIP SURGICAL 1/2 X 6 IN (GAUZE/BANDAGES/DRESSINGS) IMPLANT
STRIP SURGICAL 1/4 X 6 IN (GAUZE/BANDAGES/DRESSINGS) IMPLANT
STRIP SURGICAL 3/4 X 6 IN (GAUZE/BANDAGES/DRESSINGS) IMPLANT
SUT STRATAFIX 1PDS 45CM VIOLET (SUTURE) ×3 IMPLANT
SUT STRATAFIX MNCRL+ 3-0 PS-2 (SUTURE) ×1
SUT STRATAFIX MONOCRYL 3-0 (SUTURE) ×2
SUT STRATAFIX SPIRAL + 2-0 (SUTURE) ×3 IMPLANT
SUT VIC AB 0 CT1 18XCR BRD8 (SUTURE) ×2 IMPLANT
SUT VIC AB 0 CT1 8-18 (SUTURE) ×4
SUT VIC AB 2-0 CT1 18 (SUTURE) IMPLANT
SUT VIC AB 3-0 SH 8-18 (SUTURE) ×3 IMPLANT
SUTURE STRATFX MNCRL+ 3-0 PS-2 (SUTURE) ×1 IMPLANT
SYR 30ML LL (SYRINGE) ×3 IMPLANT
SYR 3ML LL SCALE MARK (SYRINGE) IMPLANT
SYRINGE 20CC LL (MISCELLANEOUS) ×3 IMPLANT
TOWEL GREEN STERILE (TOWEL DISPOSABLE) ×3 IMPLANT
TOWEL GREEN STERILE FF (TOWEL DISPOSABLE) ×3 IMPLANT
TRAY FOLEY W/METER SILVER 16FR (SET/KITS/TRAYS/PACK) IMPLANT
UNDERPAD 30X30 (UNDERPADS AND DIAPERS) ×3 IMPLANT
WATER STERILE IRR 1000ML POUR (IV SOLUTION) ×3 IMPLANT
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
## 2016-09-06 NOTE — Progress Notes (Signed)
Report given to maria rn as caregiver 

## 2016-09-06 NOTE — Transfer of Care (Signed)
Immediate Anesthesia Transfer of Care Note  Patient: Andre Salinas  Procedure(s) Performed: Procedure(s) with comments: Posterior C4-7 Laminectomy for decompression with internal fixation and fusion (N/A) - C4-7 Laminectomy for decompression with internal fixation and fusion  Patient Location: PACU  Anesthesia Type:General  Level of Consciousness: drowsy and patient cooperative  Airway & Oxygen Therapy: Patient Spontanous Breathing and Patient connected to face mask oxygen  Post-op Assessment: Report given to RN and Post -op Vital signs reviewed and stable  Post vital signs: Reviewed and stable  Last Vitals:  Vitals:   09/06/16 0600  BP: 129/77  Resp: 18  Temp: 36.6 C  SpO2: 99%    Last Pain:  Vitals:   09/06/16 0600  TempSrc: Oral  PainSc:       Patients Stated Pain Goal: 3 (09/06/16 0545)  Complications: No apparent anesthesia complications
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
## 2016-09-06 NOTE — Anesthesia Procedure Notes (Signed)
Procedure Name: Intubation Date/Time: 09/06/2016 7:50 AM Performed by: Geraldo DockerSOLHEIM, Jacqueline Delapena SALOMON Pre-anesthesia Checklist: Patient identified, Patient being monitored, Timeout performed, Emergency Drugs available and Suction available Patient Re-evaluated:Patient Re-evaluated prior to induction Oxygen Delivery Method: Circle System Utilized Preoxygenation: Pre-oxygenation with 100% oxygen Induction Type: IV induction Ventilation: Mask ventilation without difficulty Laryngoscope Size: Glidescope and 4 Grade View: Grade I Tube type: Oral Tube size: 8.0 mm Number of attempts: 1 Airway Equipment and Method: Stylet and Video-laryngoscopy Placement Confirmation: ETT inserted through vocal cords under direct vision,  positive ETCO2 and breath sounds checked- equal and bilateral Secured at: 24 cm Tube secured with: Tape Dental Injury: Teeth and Oropharynx as per pre-operative assessment  Difficulty Due To: Difficult Airway-  due to neck instability

## 2016-09-06 NOTE — Anesthesia Postprocedure Evaluation (Signed)
Anesthesia Post Note  Patient: Estanislado PandyJames Huyser  Procedure(s) Performed: Procedure(s) (LRB): Posterior C4-7 Laminectomy for decompression with internal fixation and fusion (N/A)     Patient location during evaluation: PACU Anesthesia Type: General Level of consciousness: awake and alert, oriented and patient cooperative Pain management: pain level controlled Vital Signs Assessment: post-procedure vital signs reviewed and stable Respiratory status: spontaneous breathing, nonlabored ventilation and respiratory function stable Cardiovascular status: blood pressure returned to baseline and stable Postop Assessment: no signs of nausea or vomiting Anesthetic complications: no    Last Vitals:  Vitals:   09/06/16 1230 09/06/16 1245  BP: 128/86 107/75  Pulse: 84 69  Resp: 10 (!) 8  Temp:  (!) 36 C  SpO2: 99% 96%    Last Pain:  Vitals:   09/06/16 1230  TempSrc:   PainSc: 0-No pain                 Lelan Cush,E. Ishmail Mcmanamon

## 2016-09-07 MED ORDER — OXYCODONE HCL 5 MG PO TABS: 5.0000 mg | ORAL_TABLET | ORAL | 0 refills | Status: AC | PRN

## 2016-09-07 MED ORDER — OXYCODONE HCL ER 15 MG PO T12A: 15.0000 mg | EXTENDED_RELEASE_TABLET | Freq: Two times a day (BID) | ORAL | 0 refills | Status: AC

## 2016-09-07 MED ORDER — METHOCARBAMOL 750 MG PO TABS: 750.0000 mg | ORAL_TABLET | Freq: Four times a day (QID) | ORAL | 3 refills | Status: AC

## 2016-09-07 NOTE — Care Management Note (Signed)
Case Management Note  Patient Details  Name: Estanislado PandyJames Shives MRN: [id number] Date of Birth: 07/26/1980  Subjective/Objective:                 Patient with order to DC to home today. Chart reviewed. No Home Health or Equipment needs, no unacknowledged Case Management consults or medication needs identified at the time of this note. Plan for DC to home. If needs arise today prior to discharge, please call Lawerance SabalDebbie Kaeden Mester RN CM at [phone].    Action/Plan:   Expected Discharge Date:  09/07/16               Expected Discharge Plan:  Home/Self Care  In-House Referral:     Discharge planning Services  CM Consult  Post Acute Care Choice:    Choice offered to:     DME Arranged:    DME Agency:     HH Arranged:    HH Agency:     Status of Service:  Completed, signed off  If discussed at [employer] Length of Stay Meetings, dates discussed:    Additional Comments:  Lawerance SabalDebbie Terryl Molinelli, RN 09/07/2016, 11:07 AM

## 2016-09-07 NOTE — Progress Notes (Signed)
Patient is discharged from room 3C07 at this time. Alert and in stable condition. IV site d/c'd and instructions read to patient and mother with understanding verbalized. Left unit via wheelchair with all belongings at side. 
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
## 2016-09-09 ENCOUNTER — Encounter (HOSPITAL_COMMUNITY): Payer: Self-pay | Admitting: Neurological Surgery

## 2016-09-09 MED FILL — Vancomycin HCl For IV Soln 1 GM (Base Equivalent): INTRAVENOUS | Qty: 1000 | Status: AC

## 2016-09-10 MED FILL — Thrombin For Soln 5000 Unit: CUTANEOUS | Qty: 5000 | Status: AC

## 2016-09-12 NOTE — Discharge Summary (Signed)
Physician Discharge Summary  Patient ID: Andre Salinas MRN: [id number] DOB/AGE: 35/04/1980 35 y.o.  Admit date: 09/06/2016 Discharge date: 09/07/2016  Admission Diagnoses:Cervical stenosis with myelopathy  Discharge Diagnoses: Cervical stenosis with myelopathy  Active Problems:   Cervical stenosis of spinal canal   Discharged Condition: fair  Hospital Course: Patient was admitted for surgical decompression which she tolerated well  Consults: None  Significant Diagnostic Studies: None  Treatments: surgery: Cervical laminectomy C3-C7 with posterior fixation C3-C7  Discharge Exam: Blood pressure 111/77, pulse 76, temperature 98.7 F (37.1 C), resp. rate 18, height 6\' 3"  (1.905 m), weight 104.3 kg (230 lb), SpO2 98 %. Incision is clean and dry ambulatory status is intact  Disposition: 01-Home or Self Care  Discharge Instructions    Call MD for:  redness, tenderness, or signs of infection (pain, swelling, redness, odor or green/yellow discharge around incision site)    Complete by:  As directed    Call MD for:  severe uncontrolled pain    Complete by:  As directed    Call MD for:  temperature >100.4    Complete by:  As directed    Diet - low sodium heart healthy    Complete by:  As directed    Increase activity slowly    Complete by:  As directed      Allergies as of 09/07/2016      Reactions   Banana Itching   Roof of mouth itches      Medication List    TAKE these medications   gabapentin 300 MG capsule Commonly known as:  NEURONTIN Take 300 mg by mouth 3 (three) times daily. Not started until after surgery   gabapentin 100 MG capsule Commonly known as:  NEURONTIN Take 2 capsules (200 mg total) by mouth 3 (three) times daily.   ibuprofen 600 MG tablet Commonly known as:  ADVIL,MOTRIN Take 500 mg by mouth. Takes qd prn   methocarbamol 750 MG tablet Commonly known as:  ROBAXIN Take 1 tablet (750 mg total) by mouth 4 (four) times daily.   oxyCODONE 5 MG  immediate release tablet Commonly known as:  Oxy IR/ROXICODONE Take 1-2 tablets (5-10 mg total) by mouth every 3 (three) hours as needed for severe pain.   oxyCODONE 15 mg 12 hr tablet Commonly known as:  OXYCONTIN Take 1 tablet (15 mg total) by mouth every 12 (twelve) hours.      Follow-up Information    Ditty, Loura HaltBenjamin Jared, MD Follow up.   Specialty:  Neurosurgery Contact information: [address] [place] [address] [phone]           Signed: Stefani DamaLSNER,Veneda Kirksey J 09/12/2016, 9:19 AM

## 2017-02-09 ENCOUNTER — Other Ambulatory Visit: Payer: Self-pay

## 2017-02-09 ENCOUNTER — Emergency Department (HOSPITAL_COMMUNITY)
Admission: EM | Admit: 2017-02-09 | Discharge: 2017-02-09 | Disposition: A | Discharge status: Home / Self Care | Payer: Commercial Managed Care - PPO | Attending: Emergency Medicine | Admitting: Emergency Medicine

## 2017-02-09 ENCOUNTER — Encounter (HOSPITAL_COMMUNITY): Payer: Self-pay | Admitting: Emergency Medicine

## 2017-02-09 DIAGNOSIS — Z79899 Other long term (current) drug therapy: Secondary | ICD-10-CM | POA: Insufficient documentation

## 2017-02-09 DIAGNOSIS — H00011 Hordeolum externum right upper eyelid: Secondary | ICD-10-CM | POA: Insufficient documentation

## 2017-02-09 MED ORDER — ERYTHROMYCIN 5 MG/GM OP OINT
1.0000 "application " | TOPICAL_OINTMENT | Freq: Once | OPHTHALMIC | Status: AC
  Administered 2017-02-09: 1 via OPHTHALMIC
  Filled 2017-02-09: qty 3.5

## 2017-02-09 MED ORDER — FLUORESCEIN SODIUM 1 MG OP STRP
1.0000 | ORAL_STRIP | Freq: Once | OPHTHALMIC | Status: AC
  Administered 2017-02-09: 1 via OPHTHALMIC
  Filled 2017-02-09: qty 1

## 2017-02-09 MED ORDER — TETRACAINE HCL 0.5 % OP SOLN
1.0000 [drp] | Freq: Once | OPHTHALMIC | Status: AC
  Administered 2017-02-09: 1 [drp] via OPHTHALMIC
  Filled 2017-02-09: qty 4

## 2017-02-09 NOTE — ED Provider Notes (Signed)
[facility]   CSN: [id number] Arrival date & time: 02/09/17  2212     History   Chief Complaint Chief Complaint  Patient presents with  . Eye Pain    HPI Andre Salinas is a 37 y.o. male who presents to the ED with swelling to the right upper eye lid. The swelling started today. Patient reports having irritation in the past but never swelling before now. Patient denies changes in his vision.He does report pain in the upper lid.   HPI  Past Medical History:  Diagnosis Date  . Headache     Patient Active Problem List   Diagnosis Date Noted  . Cervical stenosis of spinal canal 09/06/2016    Past Surgical History:  Procedure Laterality Date  . NO PAST SURGERIES    . POSTERIOR CERVICAL FUSION/FORAMINOTOMY N/A 09/06/2016   Procedure: Posterior C4-7 Laminectomy for decompression with internal fixation and fusion;  Surgeon: Ditty, Loura HaltBenjamin Jared, MD;  [facility];  Service: Neurosurgery;  Laterality: N/A;  C4-7 Laminectomy for decompression with internal fixation and fusion       Home Medications    Prior to Admission medications   Medication Sig Start Date End Date Taking? Authorizing Provider  gabapentin (NEURONTIN) 100 MG capsule Take 2 capsules (200 mg total) by mouth 3 (three) times daily. 08/16/16   Blane OharaZavitz, Joshua, MD  gabapentin (NEURONTIN) 300 MG capsule Take 300 mg by mouth 3 (three) times daily. Not started until after surgery    [provider]  ibuprofen (ADVIL,MOTRIN) 600 MG tablet Take 500 mg by mouth. Takes qd prn    [provider]  methocarbamol (ROBAXIN) 750 MG tablet Take 1 tablet (750 mg total) by mouth 4 (four) times daily. 09/07/16   Barnett AbuElsner, Henry, MD  oxyCODONE (OXY IR/ROXICODONE) 5 MG immediate release tablet Take 1-2 tablets (5-10 mg total) by mouth every 3 (three) hours as needed for severe pain. 09/07/16   Barnett AbuElsner, Henry, MD  oxyCODONE (OXYCONTIN) 15 mg 12 hr tablet Take 1 tablet  (15 mg total) by mouth every 12 (twelve) hours. 09/07/16   Barnett AbuElsner, Henry, MD    Family History No family history on file.  Social History Social History   Tobacco Use  . Smoking status: Never Smoker  . Smokeless tobacco: Never Used  Substance Use Topics  . Alcohol use: No  . Drug use: No     Allergies   Banana   Review of Systems Review of Systems  Eyes: Positive for photophobia, discharge, redness and itching.       Pain in upper lid right  All other systems reviewed and are negative.    Physical Exam Updated Vital Signs BP 128/76 (BP Location: Right Arm)   Pulse 60   Temp 98.1 F (36.7 C) (Oral)   Resp 17   SpO2 99%   Physical Exam  Constitutional: He appears well-developed and well-nourished. No distress.  HENT:  Head: Normocephalic.  Eyes: EOM are normal. Pupils are equal, round, and reactive to light. Right eye exhibits discharge and hordeolum. No foreign body present in the right eye. Right conjunctiva is injected.  Fundoscopic exam:      The right eye shows exudate.  Slit lamp exam:      The right eye shows no corneal abrasion, no corneal ulcer, no foreign body and no fluorescein uptake.  Neck: Neck supple.  Cardiovascular: Normal rate.  Pulmonary/Chest: Effort normal.  Musculoskeletal: Normal range of motion.  Neurological: He is  alert.  Skin: Skin is warm and dry.  Psychiatric: He has a normal mood and affect.  Nursing note and vitals reviewed.    ED Treatments / Results  Labs (all labs ordered are listed, but only abnormal results are displayed) Labs Reviewed - No data to display  Radiology No results found.  Procedures Procedures (including critical care time)  Medications Ordered in ED Medications  erythromycin ophthalmic ointment 1 application (not administered)  tetracaine (PONTOCAINE) 0.5 % ophthalmic solution 1 drop (1 drop Right Eye Given 02/09/17 2303)  fluorescein ophthalmic strip 1 strip (1 strip Right Eye Given 02/09/17 2303)      Initial Impression / Assessment and Plan / ED Course  I have reviewed the triage vital signs and the nursing notes. Andre Salinas presents with symptoms consistent with a stye to the right upper lid.  Purulent discharge exam.  No corneal abrasions, entrapment, consensual photophobia, or dendritic staining with fluorescein study.  Presentation non-concerning for iritis, corneal abrasions, or HSV.  No evidence of preseptal or orbital cellulitis.  Pt is not a contact lens wearer.  Patient will be given erythromycin ophthalmic.  Personal hygiene and frequent handwashing discussed.  Patient advised to followup with ophthalmologist for reevaluation in several days..  Patient verbalizes understanding and is agreeable with discharge.    Final Clinical Impressions(s) / ED Diagnoses   Final diagnoses:  Hordeolum externum of right upper eyelid    ED Discharge Orders    None       Kerrie [place], [place] 02/09/17 9147    Benjiman Core, MD 02/10/17 [phone]

## 2017-02-09 NOTE — ED Triage Notes (Signed)
Pt presents with R upper eye lid swelling onset today. Pt states he has had irritation in the past but swelling has never occurred. No vision changes

## 2017-02-09 NOTE — Discharge Instructions (Addendum)
Use the eye ointment 3 times a day for the next 7 days. Follow up with the eye doctor. Return here as needed.

## 2017-02-09 NOTE — ED Notes (Signed)
Pt understood dc material. NAD Noted 
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
## 2019-06-29 IMAGING — RF DG CERVICAL SPINE 2 OR 3 VIEWS
1 series · 2 of 2 positions shown · non-contrast
Comparison: MRI August 16, 2016.

CLINICAL DATA: Posterior C4 through C7 laminectomy.

EXAM:
DG C-ARM 61-120 MIN; CERVICAL SPINE - 2-3 VIEW
FLUOROSCOPY TIME:  6 seconds.

[Series 1: run · 2 of 2 slices shown]
[im 1/2]
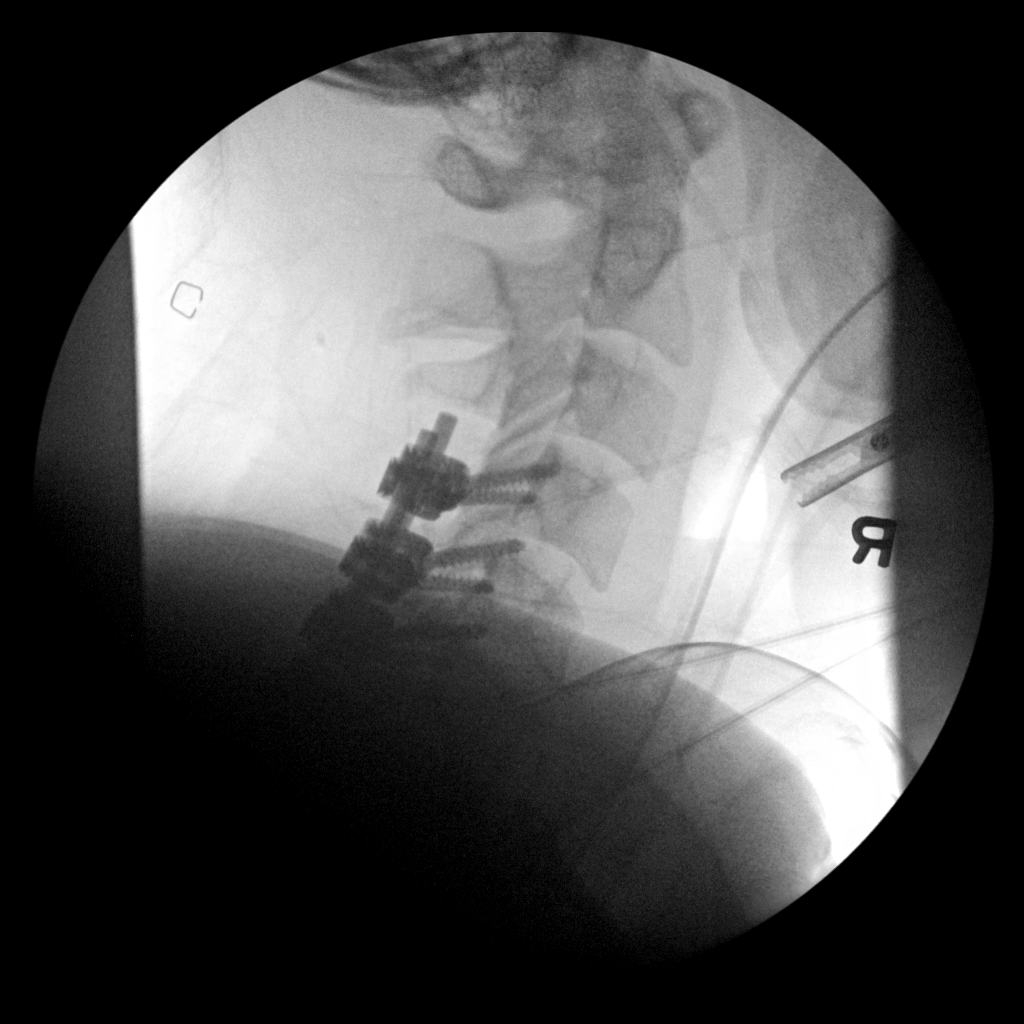
[im 2/2]
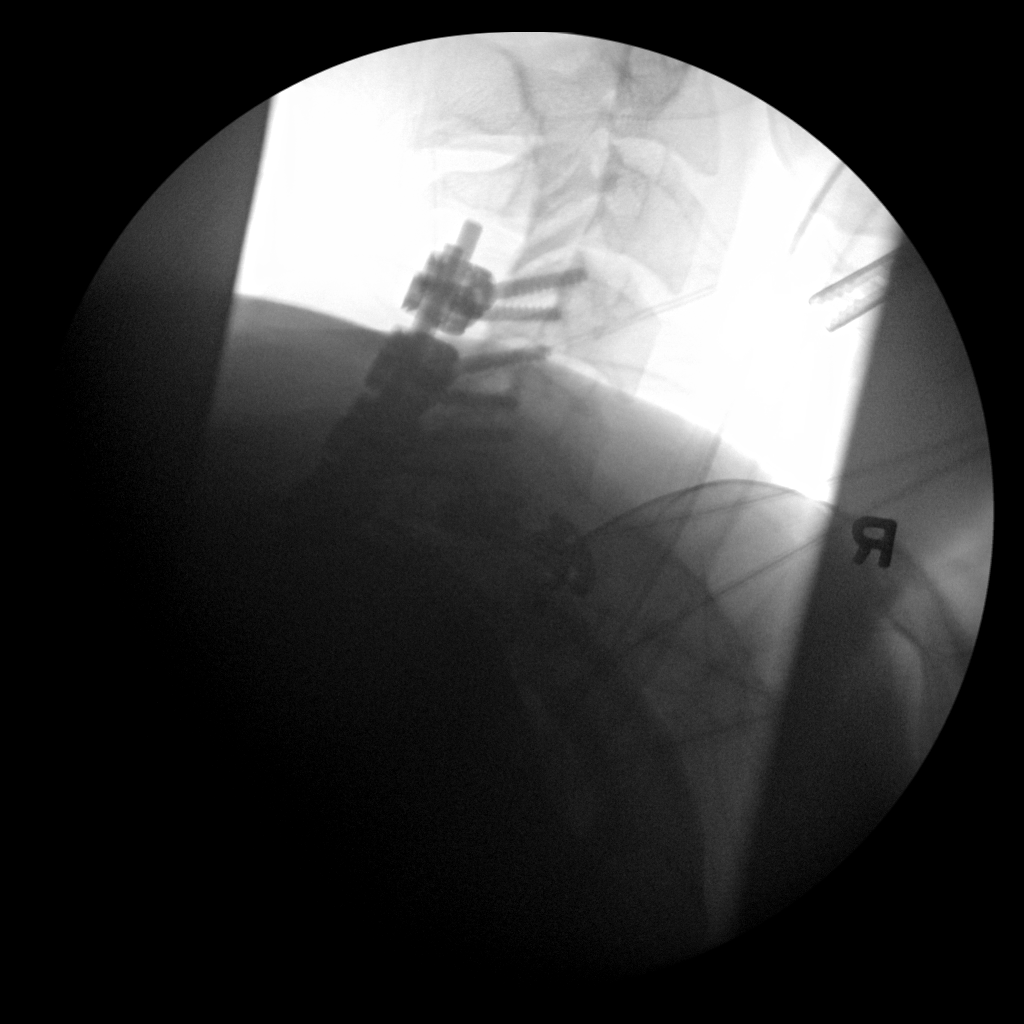

[2 of 2 positions shown; findings below may reference images not displayed]

FINDINGS: Two intraoperative fluoroscopic images were obtained of the cervical
spine. These images demonstrate surgical posterior fusion of the
cervical spine starting at C4. The distal extent could not be
visualized due to overlying soft tissue. Only the first 6 cervical
vertebra are visualized. Good alignment of the visualized vertebral
bodies is noted.
IMPRESSION: Status post surgical posterior fusion starting at C4, but distal
extent cannot be determined due to overlying soft tissue.
